# Patient Record
Sex: Male | Born: 1970 | Race: White | Hispanic: No | Marital: Single | Smoking: Current every day smoker
Health system: Southern US, Community
[De-identification: ages and names within clinical notes are randomized; demographics above are authoritative.]

---

## 1998-09-07 ENCOUNTER — Encounter: Payer: Self-pay | Admitting: Emergency Medicine

## 1998-09-07 ENCOUNTER — Emergency Department (HOSPITAL_COMMUNITY): Admission: EM | Admit: 1998-09-07 | Discharge: 1998-09-07 | Payer: Self-pay | Admitting: Emergency Medicine

## 1998-12-27 ENCOUNTER — Encounter: Payer: Self-pay | Admitting: Emergency Medicine

## 1998-12-27 ENCOUNTER — Emergency Department (HOSPITAL_COMMUNITY): Admission: EM | Admit: 1998-12-27 | Discharge: 1998-12-27 | Payer: Self-pay | Admitting: Emergency Medicine

## 2012-03-02 ENCOUNTER — Emergency Department (HOSPITAL_COMMUNITY)
Admission: EM | Admit: 2012-03-02 | Discharge: 2012-03-02 | Disposition: A | Discharge status: Home / Self Care | Payer: Self-pay | Attending: Emergency Medicine | Admitting: Emergency Medicine

## 2012-03-02 ENCOUNTER — Emergency Department (HOSPITAL_COMMUNITY): Payer: Self-pay

## 2012-03-02 ENCOUNTER — Encounter (HOSPITAL_COMMUNITY): Payer: Self-pay

## 2012-03-02 DIAGNOSIS — F17200 Nicotine dependence, unspecified, uncomplicated: Secondary | ICD-10-CM | POA: Insufficient documentation

## 2012-03-02 DIAGNOSIS — R079 Chest pain, unspecified: Secondary | ICD-10-CM | POA: Insufficient documentation

## 2012-03-02 DIAGNOSIS — R0781 Pleurodynia: Secondary | ICD-10-CM

## 2012-03-02 MED ORDER — OXYCODONE-ACETAMINOPHEN 5-325 MG PO TABS
2.0000 | ORAL_TABLET | Freq: Once | ORAL | Status: AC
  Administered 2012-03-02: 2 via ORAL
  Filled 2012-03-02: qty 2

## 2012-03-02 MED ORDER — OXYCODONE-ACETAMINOPHEN 5-325 MG PO TABS: 1.0000 | ORAL_TABLET | Freq: Four times a day (QID) | ORAL | Status: AC | PRN

## 2012-03-02 NOTE — ED Provider Notes (Signed)
History     CSN: [id number]  Arrival date & time 03/02/12  0730   First MD Initiated Contact with Patient 03/02/12 [phone]      Chief Complaint  Patient presents with  . Back Pain  . Fall    (Consider location/radiation/quality/duration/timing/severity/associated sxs/prior treatment) HPI Comments: Patient reports that while working yesterday he tripped over a hose and fell unto a tire rim.  He reports that his right lower ribs hit the tire rim when he fell.  He is currently having pain over the right lower ribs both anteriorly and posteriorly.  No pain over the thoracic spine.  He reports that he felt a pop when he fell.  He has not taken anything for pain.  Pain worse with movement and when taking deep breaths.  He denies any SOB.    Patient is a 41 y.o. male presenting with back pain and fall. The history is provided by the patient.  Back Pain  This is a new problem. The current episode started yesterday. The problem occurs constantly. The problem has not changed since onset.The pain is associated with falling. The pain does not radiate. The pain is moderate. The symptoms are aggravated by twisting and bending. Pertinent negatives include no fever, no numbness, no paresthesias, no paresis, no tingling and no weakness. He has tried nothing for the symptoms.  Fall Pertinent negatives include no fever, no numbness, no nausea, no vomiting and no tingling.    History reviewed. No pertinent past medical history.  History reviewed. No pertinent past surgical history.  No family history on file.  History  Substance Use Topics  . Smoking status: Current Everyday Smoker  . Smokeless tobacco: Not on file  . Alcohol Use: No      Review of Systems  Constitutional: Negative for fever and chills.  Respiratory: Negative for shortness of breath.   Gastrointestinal: Negative for nausea and vomiting.  Musculoskeletal: Positive for back pain. Negative for gait problem.  Skin: Negative for  color change and wound.  Neurological: Negative for tingling, weakness, numbness and paresthesias.    Allergies  Review of patient's allergies indicates no known allergies.  Home Medications   Current Outpatient Rx  Name Route Sig Dispense Refill  . IBUPROFEN 200 MG PO TABS Oral Take 800 mg by mouth every 6 (six) hours as needed. For pain      BP 167/94  Pulse 73  Temp 98 F (36.7 C) (Oral)  Resp 20  SpO2 98%  Physical Exam  Nursing note and vitals reviewed. Constitutional: He appears well-developed and well-nourished. No distress.  HENT:  Head: Normocephalic and atraumatic.  Mouth/Throat: Oropharynx is clear and moist.  Neck: Normal range of motion. Neck supple.  Cardiovascular: Normal rate, regular rhythm and normal heart sounds.   Pulmonary/Chest: Effort normal and breath sounds normal. No respiratory distress. He has no wheezes. He has no rales.   He exhibits tenderness.  Abdominal: Soft. Bowel sounds are normal.  Musculoskeletal: Normal range of motion. He exhibits no edema and no tenderness.       Cervical back: He exhibits normal range of motion, no tenderness, no bony tenderness, no swelling, no edema and no deformity.       Thoracic back: He exhibits normal range of motion, no tenderness, no bony tenderness, no swelling, no edema and no deformity.       Lumbar back: He exhibits normal range of motion, no tenderness, no bony tenderness, no swelling, no edema and no deformity.  Neurological:  He is alert.  Skin: Skin is warm, dry and intact. No abrasion, no bruising and no ecchymosis noted. He is not diaphoretic.  Psychiatric: He has a normal mood and affect.    ED Course  Procedures (including critical care time)  Labs Reviewed - No data to display Dg Ribs Unilateral W/chest Right  03/02/2012  *RADIOLOGY REPORT*  Clinical Data: Fall.  Right anterior rib pain.  RIGHT RIBS AND CHEST - 3+ VIEW  Comparison: None.  Findings: No displaced rib fracture or pneumothorax.   Trachea midline.  Cardiopericardial silhouette appears within normal limits.  The lungs are clear. Please note that plain film rib series have limited sensitivity for nondisplaced rib fractures.  If the patient continues to have pain, consider repeat examination in 1-2 weeks with marker over area of maximal tenderness.  Often periosteal reaction will be present at the site of occult rib fracture.  IMPRESSION: No displaced rib fracture.  Original Report Authenticated By: Andreas Newport, M.D.     1. Rib pain on right side       MDM  Patient with negative xrays.   Patient instructed to follow up with PCP and told that he may need to have repeat xrays in 1-2 weeks if pain persists.  Patient given short course of pain medication.  Patient in agreement with the plan.  Return precautions discussed.        Pascal Lux [place], PA-C 03/02/12 1610

## 2012-03-02 NOTE — ED Notes (Signed)
Pt in from home with c/o mid back pain and rib pain states fell yesterday from standing position, states pain has increased this am states worse with deep breathing

## 2012-03-05 NOTE — ED Provider Notes (Signed)
Medical screening examination/treatment/procedure(s) were performed by non-physician practitioner and as supervising physician I was immediately available for consultation/collaboration.  Toy Baker, MD 03/05/12 [phone]

## 2016-10-22 ENCOUNTER — Emergency Department (HOSPITAL_COMMUNITY)
Admission: EM | Admit: 2016-10-22 | Discharge: 2016-10-22 | Disposition: A | Discharge status: Home / Self Care | Payer: Self-pay | Attending: Emergency Medicine | Admitting: Emergency Medicine

## 2016-10-22 ENCOUNTER — Encounter (HOSPITAL_COMMUNITY): Payer: Self-pay

## 2016-10-22 DIAGNOSIS — L723 Sebaceous cyst: Secondary | ICD-10-CM | POA: Insufficient documentation

## 2016-10-22 DIAGNOSIS — F17200 Nicotine dependence, unspecified, uncomplicated: Secondary | ICD-10-CM | POA: Insufficient documentation

## 2016-10-22 MED ORDER — LIDOCAINE-EPINEPHRINE (PF) 2 %-1:200000 IJ SOLN
10.0000 mL | Freq: Once | INTRAMUSCULAR | Status: AC
  Administered 2016-10-22: 10 mL
  Filled 2016-10-22: qty 20

## 2016-10-22 MED ORDER — TETANUS-DIPHTH-ACELL PERTUSSIS 5-2.5-18.5 LF-MCG/0.5 IM SUSP
0.5000 mL | Freq: Once | INTRAMUSCULAR | Status: AC
  Administered 2016-10-22: 0.5 mL via INTRAMUSCULAR
  Filled 2016-10-22: qty 0.5

## 2016-10-22 MED ORDER — SULFAMETHOXAZOLE-TRIMETHOPRIM 800-160 MG PO TABS: 1.0000 | ORAL_TABLET | Freq: Two times a day (BID) | ORAL | 0 refills | Status: AC

## 2016-10-22 MED ORDER — IBUPROFEN 800 MG PO TABS: 800.0000 mg | ORAL_TABLET | Freq: Three times a day (TID) | ORAL | 0 refills | Status: DC

## 2016-10-22 NOTE — ED Provider Notes (Signed)
[facility]-EMERGENCY DEPT Provider Note   CSN: [id number] Arrival date & time: 10/22/16  1247 By signing my name below, I, Reginald HedgerElizabeth Weber, attest that this documentation has been prepared under the direction and in the presence of non-physician practitioner, Harolyn RutherfordShawn Joy, PA-C. Electronically Signed: Levon HedgerElizabeth Weber, Scribe. 10/22/2016. 1:42 PM.   History   Chief Complaint Chief Complaint  Patient presents with  . Abscess   HPI Comments: Reginald Weber is a 46 y.o. male who presents to the Emergency Department complaining of an area of moderate pain, redness, and swelling to the back onset one year ago, but progressively worsening x 3 weeks. Pt states pain is exacerbated with palpation and direct pressure. He describes the area as a "lump." He has not seen a provider for this issue in the past. No treatments have been attempted. Denies fever/chills, cough, shortness of breath, drainage from the area, or any other complaints.    The history is provided by the patient. No language interpreter was used.    History reviewed. No pertinent past medical history.  There are no active problems to display for this patient.  History reviewed. No pertinent surgical history.   Home Medications    Prior to Admission medications   Medication Sig Start Date End Date Taking? Authorizing Provider  ibuprofen (ADVIL,MOTRIN) 200 MG tablet Take 800 mg by mouth every 6 (six) hours as needed. For pain    Historical Provider, MD  ibuprofen (ADVIL,MOTRIN) 800 MG tablet Take 1 tablet (800 mg total) by mouth 3 (three) times daily. 10/22/16   Shawn C Joy, PA-C  sulfamethoxazole-trimethoprim (BACTRIM DS,SEPTRA DS) 800-160 MG tablet Take 1 tablet by mouth 2 (two) times daily. 10/22/16 10/29/16  Anselm PancoastShawn C Joy, PA-C   Family History History reviewed. No pertinent family history.  Social History Social History  Substance Use Topics  . Smoking status: Current Every Day Smoker  . Smokeless tobacco: Never Used  . Alcohol  use Yes     Comment: daily    Allergies   Patient has no known allergies.  Review of Systems Review of Systems  Constitutional: Negative for fever.  Respiratory: Negative for cough and shortness of breath.   Skin: Positive for color change.  All other systems reviewed and are negative.  Physical Exam Updated Vital Signs BP (!) 159/109 (BP Location: Right Arm)   Pulse 80   Temp 97.9 F (36.6 C) (Oral)   Resp 18   SpO2 98%   Physical Exam  Constitutional: He appears well-developed and well-nourished. No distress.  HENT:  Head: Normocephalic and atraumatic.  Eyes: Conjunctivae are normal.  Neck: Neck supple.  Cardiovascular: Normal rate and regular rhythm.   Pulmonary/Chest: Effort normal.  Neurological: He is alert.  Skin: Skin is warm and dry. He is not diaphoretic. There is erythema.  Soft, half dollar-sized, immobile mass to the upper left back about 4 cm lateral of the thoracic spine. Area is tender and erythematous. Overlying skin blanches.  Psychiatric: He has a normal mood and affect. His behavior is normal.  Nursing note and vitals reviewed.  ED Treatments / Results  DIAGNOSTIC STUDIES:  Oxygen Saturation is 98% on RA, normal by my interpretation.    COORDINATION OF CARE:  1:25 PM Discussed treatment plan with pt at bedside and pt agreed to plan.   Labs (all labs ordered are listed, but only abnormal results are displayed) Labs Reviewed - No data to display  EKG  EKG Interpretation None      Radiology No  results found.  Procedures .Marland KitchenIncision and Drainage Date/Time: 10/22/2016 3:19 PM Performed by: Anselm Pancoast Authorized by: Anselm Pancoast   Consent:    Consent obtained:  Verbal   Consent given by:  Patient   Risks discussed:  Bleeding, incomplete drainage, infection and pain Location:    Type:  Cyst   Size:  4x3cm   Location:  Trunk   Trunk location:  Back Pre-procedure details:    Skin preparation:  Betadine Anesthesia (see MAR for  exact dosages):    Anesthesia method:  Local infiltration   Local anesthetic:  Lidocaine 2% WITH epi Procedure type:    Complexity:  Complex Procedure details:    Needle aspiration: no     Incision types:  Single straight   Incision depth:  Dermal   Scalpel blade:  11   Wound management:  Probed and deloculated, irrigated with saline, debrided and extensive cleaning   Drainage characteristics: sebaceous material.   Drainage amount:  Copious   Wound treatment:  Wound left open   Packing materials:  None Post-procedure details:    Patient tolerance of procedure:  Tolerated well, no immediate complications Comments:     A large enough incision was made to allow for continued drainage.    (including critical care time)  EMERGENCY DEPARTMENT US SOFT TISSUE INTERPRETATION "Study: Limited Soft Tissue Ultrasound"  INDICATIONS: Pain Multiple views of the body part were obtained in real-time with a multi-frequency linear probe  PERFORMED BY: Myself IMAGES ARCHIVED?: No SIDE:Left BODY PART:Upper back INTERPRETATION:  Fluid collection needing drainage - Fluid collection measuring about 3x4 cm at a depth of approx. 1 cm.    Medications Ordered in ED Medications  lidocaine-EPINEPHrine (XYLOCAINE W/EPI) 2 %-1:200000 (PF) injection 10 mL (10 mLs Infiltration Given 10/22/16 1456)  Tdap (BOOSTRIX) injection 0.5 mL (0.5 mLs Intramuscular Given 10/22/16 1457)     Initial Impression / Assessment and Plan / ED Course  I have reviewed the triage vital signs and the nursing notes.  Pertinent labs & imaging results that were available during my care of the patient were reviewed by me and considered in my medical decision making (see chart for details).     Patient presents with a mass to his upper back. Likely sebaceous cysts. This was drained without immediate complication. Wound check in 3 days. Antibiotic therapy initiated due to the onset of tenderness and erythema on exam, possibly  indicating the beginning of an infected cyst. Wound care and return precautions discussed. Patient voices understanding of all instructions and is comfortable with discharge.     Final Clinical Impressions(s) / ED Diagnoses   Final diagnoses:  Sebaceous cyst   New Prescriptions Discharge Medication List as of 10/22/2016  3:24 PM    START taking these medications   Details  !! ibuprofen (ADVIL,MOTRIN) 800 MG tablet Take 1 tablet (800 mg total) by mouth 3 (three) times daily., Starting Sun 10/22/2016, Print    sulfamethoxazole-trimethoprim (BACTRIM DS,SEPTRA DS) 800-160 MG tablet Take 1 tablet by mouth 2 (two) times daily., Starting Sun 10/22/2016, Until Sun 10/29/2016, Print     !! - Potential duplicate medications found. Please discuss with provider.     I personally performed the services described in this documentation, which was scribed in my presence. The recorded information has been reviewed and is accurate.   Anselm Pancoast, PA-C 10/24/16 1610    Charlynne Pander, MD 10/25/16 [phone]

## 2016-10-22 NOTE — ED Triage Notes (Signed)
Pt here with "knot" on back.  Painful.  Started for a while.  Has worsened in past 3 weeks.

## 2016-10-22 NOTE — ED Notes (Signed)
2x2 covered by folded 4x4 secured with paper tape over I&D site (left back)

## 2016-10-22 NOTE — Discharge Instructions (Signed)
Remove the bandage after 24 hours. Clean the wound and surrounding area gently with tap water and mild soap. Rinse well and blot dry. You may shower, but avoid submerging the wound, such as with a bath or swimming. Clean the wound daily to prevent infection. You may use Tylenol, naproxen, ibuprofen for pain. You may replace the bandage for as long as the wound is draining.  Return to the ED or go to a primary care office for a wound check in 3 days.  Return to the ED sooner should signs of infection arise, such as spreading redness, puffiness/swelling, pus draining from the wound, severe increase in pain, or any other major issues.  Please take all of your antibiotics until finished!   You may develop abdominal discomfort or diarrhea from the antibiotic.  You may help offset this with probiotics which you can buy or get in yogurt. Do not eat or take the probiotics until 2 hours after your antibiotic.

## 2018-03-23 ENCOUNTER — Encounter (HOSPITAL_COMMUNITY): Payer: Self-pay | Admitting: *Deleted

## 2018-03-23 ENCOUNTER — Other Ambulatory Visit: Payer: Self-pay

## 2018-03-23 ENCOUNTER — Emergency Department (HOSPITAL_COMMUNITY)
Admission: EM | Admit: 2018-03-23 | Discharge: 2018-03-23 | Disposition: A | Discharge status: Home / Self Care | Payer: Self-pay | Attending: Emergency Medicine | Admitting: Emergency Medicine

## 2018-03-23 DIAGNOSIS — L237 Allergic contact dermatitis due to plants, except food: Secondary | ICD-10-CM | POA: Insufficient documentation

## 2018-03-23 DIAGNOSIS — L299 Pruritus, unspecified: Secondary | ICD-10-CM | POA: Insufficient documentation

## 2018-03-23 DIAGNOSIS — L255 Unspecified contact dermatitis due to plants, except food: Secondary | ICD-10-CM

## 2018-03-23 DIAGNOSIS — F17210 Nicotine dependence, cigarettes, uncomplicated: Secondary | ICD-10-CM | POA: Insufficient documentation

## 2018-03-23 MED ORDER — PREDNISONE 10 MG (21) PO TBPK: ORAL_TABLET | ORAL | 0 refills | Status: AC

## 2018-03-23 MED ORDER — CYCLOBENZAPRINE HCL 10 MG PO TABS: 10.0000 mg | ORAL_TABLET | Freq: Every day | ORAL | 0 refills | Status: DC

## 2018-03-23 MED ORDER — HYDROXYZINE HCL 25 MG PO TABS: 25.0000 mg | ORAL_TABLET | Freq: Three times a day (TID) | ORAL | 0 refills | Status: AC | PRN

## 2018-03-23 MED ORDER — BETAMETHASONE SOD PHOS & ACET 6 (3-3) MG/ML IJ SUSP
6.0000 mg | Freq: Once | INTRAMUSCULAR | Status: AC
  Administered 2018-03-23: 6 mg via INTRAMUSCULAR
  Filled 2018-03-23: qty 5

## 2018-03-23 NOTE — ED Notes (Signed)
Pt reports attempting to clear rash with "straight clorox bleach" onto his skin.

## 2018-03-23 NOTE — ED Triage Notes (Signed)
Pt complains of rash to bilateral arms and forehead since trimming poison ivy yesterday.

## 2018-03-23 NOTE — ED Provider Notes (Signed)
[facility]-EMERGENCY DEPT Provider Note   CSN: [id number] Arrival date & time: 03/23/18  1125     History   Chief Complaint Chief Complaint  Patient presents with  . Rash    HPI Reginald Weber is a 47 y.o. male who presents to the ED for a rash. The rash started yesterday. Patient reports that he was cutting bushes at work that had poison oak in them. Patient c/o itching and rash to forearms and face.   HPI  History reviewed. No pertinent past medical history.  There are no active problems to display for this patient.   History reviewed. No pertinent surgical history.      Home Medications    Prior to Admission medications   Medication Sig Start Date End Date Taking? Authorizing Provider  hydrOXYzine (ATARAX/VISTARIL) 25 MG tablet Take 1 tablet (25 mg total) by mouth every 8 (eight) hours as needed. 03/23/18   Janne Napoleon, NP  ibuprofen (ADVIL,MOTRIN) 200 MG tablet Take 800 mg by mouth every 6 (six) hours as needed. For pain    [provider]  ibuprofen (ADVIL,MOTRIN) 800 MG tablet Take 1 tablet (800 mg total) by mouth 3 (three) times daily. 10/22/16   Joy, Shawn C, PA-C  predniSONE (STERAPRED UNI-PAK 21 TAB) 10 MG (21) TBPK tablet Starting tomorrow 03/24/18 take 6 tablets (day one) then 5, 4, 3, 2, 1 03/23/18   Janne Napoleon, NP    Family History No family history on file.  Social History Social History   Tobacco Use  . Smoking status: Current Every Day Smoker  . Smokeless tobacco: Never Used  Substance Use Topics  . Alcohol use: Yes    Comment: daily  . Drug use: No     Allergies   Patient has no known allergies.   Review of Systems Review of Systems  Skin: Positive for rash.  All other systems reviewed and are negative.    Physical Exam Updated Vital Signs BP 132/89 (BP Location: Right Arm)   Pulse 74   Temp 97.7 F (36.5 C) (Oral)   Resp 16   Ht 6\' 3"  (1.905 m)   Wt 93.4 kg (206 lb)   SpO2 100%   BMI  25.75 kg/m   Physical Exam  Constitutional: He appears well-developed and well-nourished. No distress.  HENT:  Rash to face  Eyes: EOM are normal.  Neck: Neck supple.  Cardiovascular: Normal rate.  Pulmonary/Chest: Effort normal.  Musculoskeletal: Normal range of motion.  Neurological: He is alert.  Skin: Rash noted.  Vesicular, linear rash to forearms and face.   Psychiatric: He has a normal mood and affect.  Nursing note and vitals reviewed.    ED Treatments / Results  Labs (all labs ordered are listed, but only abnormal results are displayed) Labs Reviewed - No data to display  Radiology No results found.  Procedures Procedures (including critical care time)  Medications Ordered in ED Medications  betamethasone acetate-betamethasone sodium phosphate (CELESTONE) injection 6 mg (6 mg Intramuscular Given 03/23/18 1309)     Initial Impression / Assessment and Plan / ED Course  I have reviewed the triage vital signs and the nursing notes. 47 y.o. male here for rash and itching stable for d/c without signs of infection. Will treat for itching and contact dermatitis. Patient will return as needed for any problems.    Final Clinical Impressions(s) / ED Diagnoses   Final diagnoses:  Contact dermatitis due to plant  Kerrie Buffaloeese, Hope OrientM, [place]NP 03/23/18 2132    Sabas SousBero, Michael M, MD 03/24/18 [phone]

## 2018-03-23 NOTE — Discharge Instructions (Addendum)
Use Calamine lotion to help dry the areas. Take the medication as directed. The medicine for itching can make you sleepy so do not drive while taking it.

## 2018-03-23 NOTE — ED Notes (Signed)
Bed: WTR5 Expected date:  Expected time:  Means of arrival:  Comments: 

## 2018-05-14 ENCOUNTER — Other Ambulatory Visit: Payer: Self-pay

## 2018-05-14 ENCOUNTER — Emergency Department (HOSPITAL_COMMUNITY)
Admission: EM | Admit: 2018-05-14 | Discharge: 2018-05-15 | Disposition: A | Discharge status: Home / Self Care | Payer: Self-pay | Attending: Emergency Medicine | Admitting: Emergency Medicine

## 2018-05-14 ENCOUNTER — Encounter (HOSPITAL_COMMUNITY): Payer: Self-pay | Admitting: Emergency Medicine

## 2018-05-14 DIAGNOSIS — L03115 Cellulitis of right lower limb: Secondary | ICD-10-CM

## 2018-05-14 DIAGNOSIS — F17200 Nicotine dependence, unspecified, uncomplicated: Secondary | ICD-10-CM | POA: Insufficient documentation

## 2018-05-14 DIAGNOSIS — L0291 Cutaneous abscess, unspecified: Secondary | ICD-10-CM

## 2018-05-14 DIAGNOSIS — L02415 Cutaneous abscess of right lower limb: Secondary | ICD-10-CM | POA: Insufficient documentation

## 2018-05-14 MED ORDER — HYDROCODONE-ACETAMINOPHEN 5-325 MG PO TABS
1.0000 | ORAL_TABLET | Freq: Once | ORAL | Status: AC
  Administered 2018-05-14: 1 via ORAL
  Filled 2018-05-14: qty 1

## 2018-05-14 MED ORDER — IBUPROFEN 200 MG PO TABS
200.0000 mg | ORAL_TABLET | Freq: Once | ORAL | Status: AC
  Administered 2018-05-14: 200 mg via ORAL
  Filled 2018-05-14: qty 1

## 2018-05-14 MED ORDER — LIDOCAINE-EPINEPHRINE (PF) 2 %-1:200000 IJ SOLN
10.0000 mL | Freq: Once | INTRAMUSCULAR | Status: AC
  Administered 2018-05-14: 10 mL
  Filled 2018-05-14: qty 20

## 2018-05-14 MED ORDER — VANCOMYCIN HCL 10 G IV SOLR
2000.0000 mg | Freq: Once | INTRAVENOUS | Status: AC
  Administered 2018-05-14: 2000 mg via INTRAVENOUS
  Filled 2018-05-14: qty 2000

## 2018-05-14 NOTE — ED Provider Notes (Cosign Needed Addendum)
[facility]-EMERGENCY DEPT Provider Note   CSN: [id number] Arrival date & time: 05/14/18  2009     History   Chief Complaint Chief Complaint  Patient presents with  . Insect Bite    HPI Reginald Weber is a 47 y.o. male with no significant past medical history who presents today for evaluation of an wound to his right thigh.  He reports that he noticed a wound about 3 days ago and it started as an ingrown hair.  He did not see any insects in this area.  He says that it has gradually gotten worse.  He reports that he has tried warm soaks in the shower with mild relief.  He denies fevers.  No change in appetite, N/V/D.    HPI  History reviewed. No pertinent past medical history.  There are no active problems to display for this patient.   History reviewed. No pertinent surgical history.      Home Medications    Prior to Admission medications   Medication Sig Start Date End Date Taking? Authorizing Provider  cephALEXin (KEFLEX) 500 MG capsule Take 1 capsule (500 mg total) by mouth 4 (four) times daily for 10 days. 05/15/18 05/25/18  Cristina Gong, PA-C  doxycycline (VIBRAMYCIN) 100 MG capsule Take 1 capsule (100 mg total) by mouth 2 (two) times daily. 05/15/18   Cristina Gong, PA-C  HYDROcodone-acetaminophen (NORCO/VICODIN) 5-325 MG tablet Take 1 tablet by mouth every 6 (six) hours as needed. 05/15/18   Cristina Gong, PA-C  hydrOXYzine (ATARAX/VISTARIL) 25 MG tablet Take 1 tablet (25 mg total) by mouth every 8 (eight) hours as needed. Patient not taking: Reported on 05/14/2018 03/23/18   Janne Napoleon, NP  predniSONE (STERAPRED UNI-PAK 21 TAB) 10 MG (21) TBPK tablet Starting tomorrow 03/24/18 take 6 tablets (day one) then 5, 4, 3, 2, 1 Patient not taking: Reported on 05/14/2018 03/23/18   Janne Napoleon, NP    Family History No family history on file.  Social History Social History   Tobacco Use  . Smoking status: Current Every Day  Smoker  . Smokeless tobacco: Never Used  Substance Use Topics  . Alcohol use: Yes    Comment: daily  . Drug use: No     Allergies   Patient has no known allergies.   Review of Systems Review of Systems  Constitutional: Negative for chills and fever.  Respiratory: Negative for shortness of breath.   Gastrointestinal: Negative for abdominal pain, nausea and vomiting.  Genitourinary:       He denies scrotal or testicular pain or redness.    Musculoskeletal: Negative for neck pain.  Skin: Positive for color change and wound.  Neurological: Negative for weakness and headaches.  All other systems reviewed and are negative.    Physical Exam Updated Vital Signs BP (!) 99/50   Pulse 88   Temp 99.5 F (37.5 C) (Oral)   Resp 15   Ht 6\' 2"  (1.88 m)   Wt 93.2 kg   SpO2 98%   BMI 26.38 kg/m   Physical Exam  Constitutional: He appears well-developed and well-nourished. No distress.  HENT:  Head: Normocephalic and atraumatic.  Eyes: Conjunctivae are normal. Right eye exhibits no discharge. Left eye exhibits no discharge. No scleral icterus.  Neck: Normal range of motion.  Cardiovascular: Normal rate and regular rhythm.  Pulmonary/Chest: Effort normal. No stridor. No respiratory distress.  Abdominal: He exhibits no distension.  Musculoskeletal: He exhibits no edema or deformity.  Neurological: He is alert. He exhibits normal muscle tone.  Skin: Skin is warm and dry. He is not diaphoretic.  There is a 42x30 cm area of redness primarily on the medial right thigh.  There is a pustule present on the proximal aspect.  Please see clinical images.    Psychiatric: He has a normal mood and affect. His behavior is normal.  Nursing note and vitals reviewed.          Erythema measures 42cm by 30 cm ED Treatments / Results  Labs (all labs ordered are listed, but only abnormal results are displayed) Labs Reviewed - No data to display  EKG None  Radiology No results  found.  Procedures .Marland Kitchen.Incision and Drainage Date/Time: 05/14/2018 11:59 PM Performed by: Cristina GongHammond, Farhan Jean W, PA-C Authorized by: Cristina GongHammond, Irving Bloor W, PA-C   Consent:    Consent obtained:  Verbal   Consent given by:  Patient   Risks discussed:  Bleeding, incomplete drainage, pain and infection (Damage to other structures, need for additional procedures)   Alternatives discussed:  No treatment, alternative treatment and referral Location:    Type:  Abscess   Size:  1x2   Location:  Lower extremity   Lower extremity location:  Leg   Leg location:  R upper leg Pre-procedure details:    Skin preparation:  Chloraprep Anesthesia (see MAR for exact dosages):    Anesthesia method:  Local infiltration   Local anesthetic:  Lidocaine 2% WITH epi Procedure type:    Complexity:  Complex Procedure details:    Incision types:  Stab incision   Incision depth:  Subcutaneous   Scalpel blade:  11   Wound management:  Probed and deloculated   Drainage:  Purulent   Drainage amount:  Scant   Packing materials:  None Post-procedure details:    Patient tolerance of procedure:  Tolerated well, no immediate complications   EMERGENCY DEPARTMENT US SOFT TISSUE INTERPRETATION "Study: Limited Soft Tissue Ultrasound"  INDICATIONS: Pain and Soft tissue infection Multiple views of the body part were obtained in real-time with a multi-frequency linear probe  PERFORMED BY: Myself IMAGES ARCHIVED?: Yes SIDE:Right  BODY PART:Lower extremity INTERPRETATION:  Abcess present and Cellulitis present     Medications Ordered in ED Medications  vancomycin (VANCOCIN) 2,000 mg in sodium chloride 0.9 % 500 mL IVPB (2,000 mg Intravenous New Bag/Given 05/14/18 2319)  lidocaine-EPINEPHrine (XYLOCAINE W/EPI) 2 %-1:200000 (PF) injection 10 mL (10 mLs Infiltration Given by Other 05/14/18 2233)  ibuprofen (ADVIL,MOTRIN) tablet 200 mg (200 mg Oral Given 05/14/18 2232)  HYDROcodone-acetaminophen (NORCO/VICODIN) 5-325  MG per tablet 1 tablet (1 tablet Oral Given 05/14/18 2232)     Initial Impression / Assessment and Plan / ED Course  I have reviewed the triage vital signs and the nursing notes.  Pertinent labs & imaging results that were available during my care of the patient were reviewed by me and considered in my medical decision making (see chart for details).    Reginald Weber presents today for evaluation of a swelling and redness on his right medial thigh.  Ultrasound was used showing subcutaneous fluid collection.  I and D was performed.  Based on the extent of redness around wound will give dose of vancomycin.  At shift change [facility] who will follow pending studies, re-evaulate and determine disposition.    Final Clinical Impressions(s) / ED Diagnoses   Final diagnoses:  Abscess  Cellulitis of right lower extremity    ED Discharge  Orders         Ordered    HYDROcodone-acetaminophen (NORCO/VICODIN) 5-325 MG tablet  Every 6 hours PRN     05/15/18 0008    doxycycline (VIBRAMYCIN) 100 MG capsule  2 times daily     05/15/18 0008    cephALEXin (KEFLEX) 500 MG capsule  4 times daily     05/15/18 0008           Cristina Gong, PA-C 05/15/18 0010    Sabas Sous, MD 05/15/18 0018    Cristina Gong, PA-C 05/15/18 0028

## 2018-05-14 NOTE — ED Provider Notes (Signed)
Reginald Weber is a 47 y.o. male, presenting to the ED with pain, redness, and swelling to the right upper leg.  HPI from Reginald SafeElizabeth Hammond, PA-Weber: "Reginald Weber is a 47 y.o. male with no significant past medical history who presents today for evaluation of an wound to his right thigh.  He reports that he noticed a wound about 3 days ago and it started as an ingrown hair.  He did not see any insects in this area.  He says that it has gradually gotten worse.  He reports that he has tried warm soaks in the shower with mild relief.  He denies fevers.  No change in appetite, N/V/D."  History reviewed. No pertinent past medical history.   Physical Exam  BP (!) 99/50   Pulse 88   Temp 99.5 F (37.5 Weber) (Oral)   Resp 15   Ht 6\' 2"  (1.88 m)   Wt 93.2 kg   SpO2 98%   BMI 26.38 kg/m   Physical Exam  Constitutional: He appears well-developed and well-nourished. No distress.  HENT:  Head: Normocephalic and atraumatic.  Eyes: Conjunctivae are normal.  Neck: Neck supple.  Cardiovascular: Normal rate, regular rhythm, normal heart sounds and intact distal pulses.  Pulmonary/Chest: Effort normal and breath sounds normal. No respiratory distress.  Abdominal: Soft. There is no tenderness. There is no guarding.  Musculoskeletal: He exhibits no edema.  Lymphadenopathy:    He has no cervical adenopathy.  Neurological: He is alert.  Skin: Skin is warm and dry. He is not diaphoretic. There is erythema.  Erythema and tenderness as previously described by PA Reginald Weber.  Erythema is still within the previously drawn lines.  Psychiatric: He has a normal mood and affect. His behavior is normal.  Nursing note and vitals reviewed.   ED Course/Procedures   Clinical Course as of May 15 626  Tue May 14, 2018  2330 Suspect this was due to the patient's position.  BP(!): 99/50 [SJ]    Clinical Course User Index [SJ] Reginald Weber, Reginald Zagami C, PA-Weber    Procedures     MDM   Patient care handoff report  received from Reginald SafeElizabeth Hammond, PA-Weber. Plan: Patient to finish vancomycin infusion and be discharged.  Patient presents with what appears to be right upper leg cellulitis. Patient is nontoxic appearing, afebrile, not tachycardic, not tachypneic, not hypotensive, and is in no apparent distress.  He completed an infusion of vancomycin here in the ED. He is to return in 2 days for recheck. The patient was given instructions for home care as well as return precautions. Patient voices understanding of these instructions, accepts the plan, and is comfortable with discharge.   Vitals:   05/14/18 2025 05/14/18 2200 05/14/18 2230 05/14/18 2300  BP: 127/79 124/64 110/65 (!) 99/50  Pulse: (!) 104 90 86 88  Resp: 15   15  Temp: 99.5 F (37.5 Weber)     TempSrc: Oral     SpO2: 95% 96% 98% 98%  Weight: 93.2 kg     Height: 6\' 2"  (1.88 m)      Vitals:   05/14/18 2230 05/14/18 2300 05/15/18 0130 05/15/18 0152  BP: 110/65 (!) 99/50 139/75 (!) 150/74  Pulse: 86 88 86 77  Resp:  15  15  Temp:    97.7 F (36.5 Weber)  TempSrc:    Oral  SpO2: 98% 98% 96% 96%  Weight:      Height:  Reginald Pancoast, PA-Weber 05/15/18 0628    Paula Libra, MD 05/15/18 [phone]

## 2018-05-14 NOTE — ED Triage Notes (Signed)
Patient reports suspected insect bite to right thigh x3 days. Wound red and patient reports throbbing and warm to touch.

## 2018-05-15 MED ORDER — CEPHALEXIN 500 MG PO CAPS: 500.0000 mg | ORAL_CAPSULE | Freq: Four times a day (QID) | ORAL | 0 refills | Status: AC

## 2018-05-15 MED ORDER — DOXYCYCLINE HYCLATE 100 MG PO CAPS: 100.0000 mg | ORAL_CAPSULE | Freq: Two times a day (BID) | ORAL | 0 refills | Status: DC

## 2018-05-15 MED ORDER — HYDROCODONE-ACETAMINOPHEN 5-325 MG PO TABS: 1.0000 | ORAL_TABLET | Freq: Four times a day (QID) | ORAL | 0 refills | Status: AC | PRN

## 2018-05-15 NOTE — Discharge Instructions (Signed)
Please take Ibuprofen (Advil, motrin) and Tylenol (acetaminophen) to relieve your pain.  You may take up to 600 MG (3 pills) of normal strength ibuprofen every 8 hours as needed.  In between doses of ibuprofen you make take tylenol, up to 1,000 mg (two extra strength pills).  Do not take more than 3,000 mg tylenol in a 24 hour period.  Please check all medication labels as many medications such as pain and cold medications may contain tylenol.  Do not drink alcohol while taking these medications.  Do not take other NSAID'S while taking ibuprofen (such as aleve or naproxen).  Please take ibuprofen with food to decrease stomach upset. ° °Today you received medications that may make you sleepy or impair your ability to make decisions.  For the next 24 hours please do not drive, operate heavy machinery, care for a small child with out another adult present, or perform any activities that may cause harm to you or someone else if you were to fall asleep or be impaired.  ° °You are being prescribed a medication which may make you sleepy. Please follow up of listed precautions for at least 24 hours after taking one dose. ° °You may have diarrhea from the antibiotics.  It is very important that you continue to take the antibiotics even if you get diarrhea unless a medical professional tells you that you may stop taking them.  If you stop too early the bacteria you are being treated for will become stronger and you may need different, more powerful antibiotics that have more side effects and worsening diarrhea.  Please stay well hydrated and consider probiotics as they may decrease the severity of your diarrhea.   °

## 2019-09-18 ENCOUNTER — Emergency Department (HOSPITAL_COMMUNITY)
Admission: EM | Admit: 2019-09-18 | Discharge: 2019-09-18 | Disposition: A | Discharge status: Home / Self Care | Payer: Self-pay | Attending: Emergency Medicine | Admitting: Emergency Medicine

## 2019-09-18 ENCOUNTER — Other Ambulatory Visit: Payer: Self-pay

## 2019-09-18 ENCOUNTER — Emergency Department (HOSPITAL_COMMUNITY): Payer: Self-pay

## 2019-09-18 ENCOUNTER — Encounter (HOSPITAL_COMMUNITY): Payer: Self-pay

## 2019-09-18 DIAGNOSIS — M7701 Medial epicondylitis, right elbow: Secondary | ICD-10-CM | POA: Insufficient documentation

## 2019-09-18 DIAGNOSIS — Y9389 Activity, other specified: Secondary | ICD-10-CM | POA: Insufficient documentation

## 2019-09-18 DIAGNOSIS — Y990 Civilian activity done for income or pay: Secondary | ICD-10-CM | POA: Insufficient documentation

## 2019-09-18 DIAGNOSIS — Y9259 Other trade areas as the place of occurrence of the external cause: Secondary | ICD-10-CM | POA: Insufficient documentation

## 2019-09-18 DIAGNOSIS — X500XXA Overexertion from strenuous movement or load, initial encounter: Secondary | ICD-10-CM | POA: Insufficient documentation

## 2019-09-18 DIAGNOSIS — Z79899 Other long term (current) drug therapy: Secondary | ICD-10-CM | POA: Insufficient documentation

## 2019-09-18 DIAGNOSIS — F17210 Nicotine dependence, cigarettes, uncomplicated: Secondary | ICD-10-CM | POA: Insufficient documentation

## 2019-09-18 DIAGNOSIS — M25521 Pain in right elbow: Secondary | ICD-10-CM

## 2019-09-18 NOTE — Discharge Instructions (Signed)
You have been diagnosed today with right elbow pain.  At this time there does not appear to be the presence of an emergent medical condition, however there is always the potential for conditions to change. Please read and follow the below instructions.  Please return to the Emergency Department immediately for any new or worsening symptoms. Please be sure to follow up with your Primary Care Provider within one week regarding your visit today; please call their office to schedule an appointment even if you are feeling better for a follow-up visit. Your x-ray today did not show any broken bones, it did show spurs at your medial epicondyle and olecranon.  As we discussed unseen injuries may be present including tendon/ligamentous injury or unseen fractures.  You may call the orthopedic specialist Dr. Magnus Ivan on your discharge paperwork tomorrow morning to schedule a follow-up appointment for further evaluation management.  Please use rest, ice and elevation to help with your symptoms.  You may also use the splint given to you today to help protect your arm from further injury.  You may use over-the-counter anti-inflammatory such as Tylenol and ibuprofen as directed on the packaging to help with pain.  Get help right away if: Your pain is severe. You cannot move your wrist. You have numbness/weakness or tingling You have fever or redness to the area. You have any new/concerning or worsening of symptoms  Please read the additional information packets attached to your discharge summary.  Do not take your medicine if  develop an itchy rash, swelling in your mouth or lips, or difficulty breathing; call 911 and seek immediate emergency medical attention if this occurs.  Note: Portions of this text may have been transcribed using voice recognition software. Every effort was made to ensure accuracy; however, inadvertent computerized transcription errors may still be present.

## 2019-09-18 NOTE — ED Triage Notes (Signed)
Patient c/o chronic right elbow pain recently worsening. States heavy lifting at work. Reports usually able to drink alcohol to help with pain but was unable to sleep last night because of pain.

## 2019-09-18 NOTE — ED Provider Notes (Signed)
[facility]-EMERGENCY DEPT Provider Note   CSN: [id number] Arrival date & time: 09/18/19  1523     History Chief Complaint  Patient presents with  . Arm Pain    Reginald Weber is a 49 y.o. male presents today for right elbow pain.  Patient reports that he works at a [employer], 1 month ago he was lifting a tire when he heard a pop in his right elbow, he reports the next day he developed pain around his medial epicondyle, he describes a throbbing sensation constant worsened with movement and palpation nonradiating improved with rest and moderate intensity.  He denies any fall/injury, fever/chills, swelling/color change, numbness/tingling, weakness or any additional concerns.  HPI     History reviewed. No pertinent past medical history.  There are no problems to display for this patient.   History reviewed. No pertinent surgical history.     History reviewed. No pertinent family history.  Social History   Tobacco Use  . Smoking status: Current Every Day Smoker  . Smokeless tobacco: Never Used  Substance Use Topics  . Alcohol use: Yes    Comment: daily  . Drug use: No    Home Medications Prior to Admission medications   Medication Sig Start Date End Date Taking? Authorizing Provider  doxycycline (VIBRAMYCIN) 100 MG capsule Take 1 capsule (100 mg total) by mouth 2 (two) times daily. 05/15/18   Cristina Gong, PA-C  HYDROcodone-acetaminophen (NORCO/VICODIN) 5-325 MG tablet Take 1 tablet by mouth every 6 (six) hours as needed. 05/15/18   Cristina Gong, PA-C  hydrOXYzine (ATARAX/VISTARIL) 25 MG tablet Take 1 tablet (25 mg total) by mouth every 8 (eight) hours as needed. Patient not taking: Reported on 05/14/2018 03/23/18   Janne Napoleon, NP  predniSONE (STERAPRED UNI-PAK 21 TAB) 10 MG (21) TBPK tablet Starting tomorrow 03/24/18 take 6 tablets (day one) then 5, 4, 3, 2, 1 Patient not taking: Reported on 05/14/2018  03/23/18   Janne Napoleon, NP    Allergies    Patient has no known allergies.  Review of Systems   Review of Systems Ten systems are reviewed and are negative for acute change except as noted in the HPI  Physical Exam Updated Vital Signs BP 138/84 (BP Location: Left Arm)   Pulse 72   Temp 97.9 F (36.6 C) (Oral)   Resp 20   SpO2 100%   Physical Exam Constitutional:      General: He is not in acute distress.    Appearance: Normal appearance. He is well-developed. He is not ill-appearing or diaphoretic.  HENT:     Head: Normocephalic and atraumatic.     Right Ear: External ear normal.     Left Ear: External ear normal.     Nose: Nose normal.  Eyes:     General: Vision grossly intact. Gaze aligned appropriately.     Pupils: Pupils are equal, round, and reactive to light.  Neck:     Trachea: Trachea and phonation normal. No tracheal deviation.  Cardiovascular:     Rate and Rhythm: Normal rate and regular rhythm.     Pulses:          Radial pulses are 2+ on the right side and 2+ on the left side.  Pulmonary:     Effort: Pulmonary effort is normal. No respiratory distress.  Abdominal:     General: There is no distension.     Palpations: Abdomen is soft.  Tenderness: There is no abdominal tenderness. There is no guarding or rebound.  Musculoskeletal:        General: Normal range of motion.     Right shoulder: Normal. No tenderness. Normal range of motion.     Left shoulder: Normal. No tenderness. Normal range of motion.     Right upper arm: Normal. No swelling or tenderness.     Left upper arm: Normal. No swelling or tenderness.     Right elbow: No swelling or effusion. Tenderness present in medial epicondyle.     Left elbow: Normal.     Right forearm: Normal. No swelling or tenderness.     Left forearm: Normal. No swelling or tenderness.     Right wrist: Normal. No tenderness. Normal range of motion.     Left wrist: Normal. No tenderness. Normal range of motion.      Right hand: Normal. Normal sensation. Normal capillary refill.     Left hand: Normal. Normal sensation. Normal capillary refill.     Cervical back: Normal range of motion.  Skin:    General: Skin is warm and dry.     Capillary Refill: Capillary refill takes less than 2 seconds.  Neurological:     Mental Status: He is alert.     GCS: GCS eye subscore is 4. GCS verbal subscore is 5. GCS motor subscore is 6.     Comments: Speech is clear and goal oriented, follows commands Major Cranial nerves without deficit, no facial droop Moves extremities without ataxia, coordination intact  Psychiatric:        Behavior: Behavior normal.     ED Results / Procedures / Treatments   Labs (all labs ordered are listed, but only abnormal results are displayed) Labs Reviewed - No data to display  EKG None  Radiology DG Elbow Complete Right  Result Date: 09/18/2019 CLINICAL DATA:  Pain for 1 month recently worsening, does heavy lifting at work EXAM: RIGHT ELBOW - COMPLETE 3+ VIEW COMPARISON:  None FINDINGS: Osseous mineralization normal. Olecranon spur. Additional medial epicondylar spur. No acute fracture, dislocation, or bone destruction. No joint effusion. IMPRESSION: No acute osseous abnormalities. Spurs at the medial epicondyle and olecranon. Electronically Signed   By: Ulyses Southward M.D.   On: 09/18/2019 16:55    Procedures Procedures (including critical care time)  Medications Ordered in ED Medications - No data to display  ED Course  I have reviewed the triage vital signs and the nursing notes.  Pertinent labs & imaging results that were available during my care of the patient were reviewed by me and considered in my medical decision making (see chart for details).    MDM Rules/Calculators/A&P                     DG Right Elbow:  IMPRESSION:  No acute osseous abnormalities.    Spurs at the medial epicondyle and olecranon.   - 49 year old male presents today for 1 month of right  elbow pain after lifting a tire at work.  He has tenderness around his right medial epicondyle, no overlying skin changes effusion or swelling.  He is neurovascular intact distally with good capillary refill, sensation and strong equal pulses.  Movement at the right shoulder, neck, wrist and hand is intact with appropriate strength.  He has some decreased range of motion at the right elbow due to pain.  No evidence of septic arthritis, cellulitis, DVT, compartment syndrome or other emergent pathologies.  Suspect patient today with  medial epicondylitis today, will place patient in sling and encouraged rice therapy and OTC anti-inflammatories.  Will give referral to orthopedist for further evaluation.  Patient states understanding of limitations of x-ray imaging today and that further work-up will be needed if symptoms do not improve.  He will be given work note.  At this time there does not appear to be any evidence of an acute emergency medical condition and the patient appears stable for discharge with appropriate outpatient follow up. Diagnosis was discussed with patient who verbalizes understanding of care plan and is agreeable to discharge. I have discussed return precautions with patient who verbalizes understanding of return precautions. Patient encouraged to follow-up with their PCP. All questions answered.  Note: Portions of this report may have been transcribed using voice recognition software. Every effort was made to ensure accuracy; however, inadvertent computerized transcription errors may still be present. Final Clinical Impression(s) / ED Diagnoses Final diagnoses:  Right elbow pain  Medial epicondylitis of right elbow    Rx / DC Orders ED Discharge Orders    None       Gari Crown 09/18/19 1714    Fredia Sorrow, MD 09/19/19 [phone]

## 2019-09-18 NOTE — ED Notes (Signed)
An After Visit Summary was printed and given to the patient. Discharge instructions given and no further questions at this time.  

## 2019-10-01 ENCOUNTER — Ambulatory Visit: Payer: Self-pay | Admitting: Orthopaedic Surgery

## 2019-11-03 ENCOUNTER — Encounter (HOSPITAL_COMMUNITY): Payer: Self-pay | Admitting: Obstetrics and Gynecology

## 2019-11-03 ENCOUNTER — Other Ambulatory Visit: Payer: Self-pay

## 2019-11-03 ENCOUNTER — Emergency Department (HOSPITAL_COMMUNITY)
Admission: EM | Admit: 2019-11-03 | Discharge: 2019-11-03 | Disposition: A | Discharge status: Home / Self Care | Payer: Self-pay | Attending: Emergency Medicine | Admitting: Emergency Medicine

## 2019-11-03 DIAGNOSIS — F17210 Nicotine dependence, cigarettes, uncomplicated: Secondary | ICD-10-CM | POA: Insufficient documentation

## 2019-11-03 DIAGNOSIS — Y999 Unspecified external cause status: Secondary | ICD-10-CM | POA: Insufficient documentation

## 2019-11-03 DIAGNOSIS — Y929 Unspecified place or not applicable: Secondary | ICD-10-CM | POA: Insufficient documentation

## 2019-11-03 DIAGNOSIS — S90821A Blister (nonthermal), right foot, initial encounter: Secondary | ICD-10-CM | POA: Insufficient documentation

## 2019-11-03 DIAGNOSIS — Y9301 Activity, walking, marching and hiking: Secondary | ICD-10-CM | POA: Insufficient documentation

## 2019-11-03 DIAGNOSIS — X58XXXA Exposure to other specified factors, initial encounter: Secondary | ICD-10-CM | POA: Insufficient documentation

## 2019-11-03 MED ORDER — DOXYCYCLINE HYCLATE 100 MG PO TABS: 100.0000 mg | ORAL_TABLET | Freq: Two times a day (BID) | ORAL | 0 refills | Status: AC

## 2019-11-03 MED ORDER — DOXYCYCLINE HYCLATE 100 MG PO CAPS
100.0000 mg | ORAL_CAPSULE | Freq: Two times a day (BID) | ORAL | 0 refills | Status: AC
Start: 2019-11-03 — End: ?

## 2019-11-03 NOTE — ED Provider Notes (Signed)
[facility]-EMERGENCY DEPT Provider Note   CSN: [id number] Arrival date & time: 11/03/19  1647     History Chief Complaint  Patient presents with  . Foot Pain    Reginald Weber is a 49 y.o. male.  The history is provided by the patient. No language interpreter was used.  Foot Pain This is a new problem. The current episode started yesterday. The problem occurs constantly. The problem has not changed since onset.Nothing aggravates the symptoms. Nothing relieves the symptoms. He has tried nothing for the symptoms. The treatment provided no relief.   Pt reports he walked 14 miles yesterday after his car broke down.  Pt complains of a blister on his foot.  Pt has a callus on his foot.  Pt has a blister on callous.     No past medical history on file.  There are no problems to display for this patient.   No past surgical history on file.     No family history on file.  Social History   Tobacco Use  . Smoking status: Current Every Day Smoker    Packs/day: 1.00    Years: 25.00    Pack years: 25.00    Types: Cigarettes  . Smokeless tobacco: Never Used  Substance Use Topics  . Alcohol use: Yes    Comment: daily  . Drug use: No    Home Medications Prior to Admission medications   Medication Sig Start Date End Date Taking? Authorizing Provider  doxycycline (VIBRA-TABS) 100 MG tablet Take 1 tablet (100 mg total) by mouth 2 (two) times daily. 11/03/19   Elson Areas, PA-C  doxycycline (VIBRAMYCIN) 100 MG capsule Take 1 capsule (100 mg total) by mouth 2 (two) times daily. 11/03/19   Elson Areas, PA-C  HYDROcodone-acetaminophen (NORCO/VICODIN) 5-325 MG tablet Take 1 tablet by mouth every 6 (six) hours as needed. 05/15/18   Cristina Gong, PA-C  hydrOXYzine (ATARAX/VISTARIL) 25 MG tablet Take 1 tablet (25 mg total) by mouth every 8 (eight) hours as needed. Patient not taking: Reported on 05/14/2018 03/23/18   Janne Napoleon, NP  predniSONE  (STERAPRED UNI-PAK 21 TAB) 10 MG (21) TBPK tablet Starting tomorrow 03/24/18 take 6 tablets (day one) then 5, 4, 3, 2, 1 Patient not taking: Reported on 05/14/2018 03/23/18   Janne Napoleon, NP    Allergies    Patient has no known allergies.  Review of Systems   Review of Systems  All other systems reviewed and are negative.   Physical Exam Updated Vital Signs BP (!) 158/99 (BP Location: Right Arm)   Pulse (!) 102   Temp 98.8 F (37.1 C) (Oral)   Resp 16   SpO2 99%   Physical Exam Vitals and nursing note reviewed.  Constitutional:      Appearance: He is well-developed.  HENT:     Head: Normocephalic.  Pulmonary:     Effort: Pulmonary effort is normal.  Abdominal:     General: There is no distension.  Musculoskeletal:        General: Normal range of motion.     Cervical back: Normal range of motion.     Comments: 5cm blister base of foot at metatarsal area,  Slight redness around area   Skin:    General: Skin is warm.  Neurological:     Mental Status: He is alert and oriented to person, place, and time.  Psychiatric:        Mood and Affect: Mood normal.  ED Results / Procedures / Treatments   Labs (all labs ordered are listed, but only abnormal results are displayed) Labs Reviewed - No data to display  EKG None  Radiology No results found.  Procedures Procedures (including critical care time)  Medications Ordered in ED Medications - No data to display  ED Course  I have reviewed the triage vital signs and the nursing notes.  Pertinent labs & imaging results that were available during my care of the patient were reviewed by me and considered in my medical decision making (see chart for details).    MDM Rules/Calculators/A&P                      MDM:  Pt bandaged, placed in an ace wrap and post op shoe  Final Clinical Impression(s) / ED Diagnoses Final diagnoses:  Blister of right foot, initial encounter    Rx / DC Orders ED Discharge Orders           Ordered    doxycycline (VIBRA-TABS) 100 MG tablet  2 times daily     11/03/19 1755    doxycycline (VIBRAMYCIN) 100 MG capsule  2 times daily     11/03/19 1755        An After Visit Summary was printed and given to the patient.    Fransico Meadow, [place] 11/03/19 Ward Chatters    Charlesetta Shanks, MD 11/05/19 Shelah Lewandowsky

## 2019-11-03 NOTE — ED Triage Notes (Signed)
Pt reports right sided foot pain. Patient reports he walked 14 miles yesterday and has a big blister where he has been cutting off his calluses on his foot. Suspicious red and hot area for cellulitis

## 2020-05-21 IMAGING — CR DG ELBOW COMPLETE 3+V*R*
4 series · 4 of 4 positions shown · non-contrast
Comparison: None

CLINICAL DATA: Pain for 1 month recently worsening, does heavy
lifting at work

EXAM:
RIGHT ELBOW - COMPLETE 3+ VIEW

[x elbow ap right]
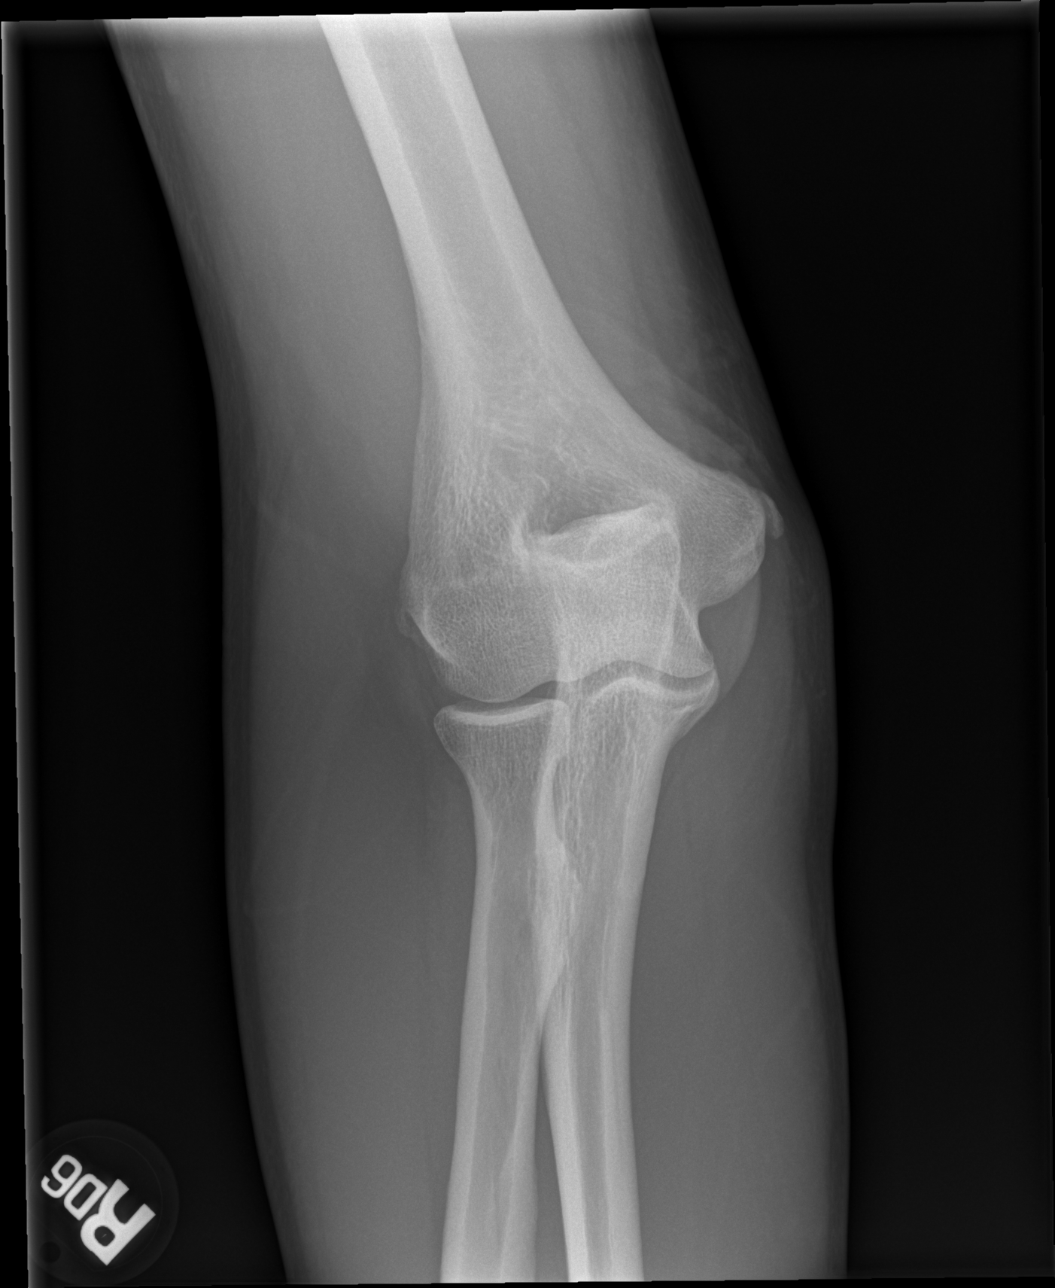

[x elbow obl right (1 of 2)]
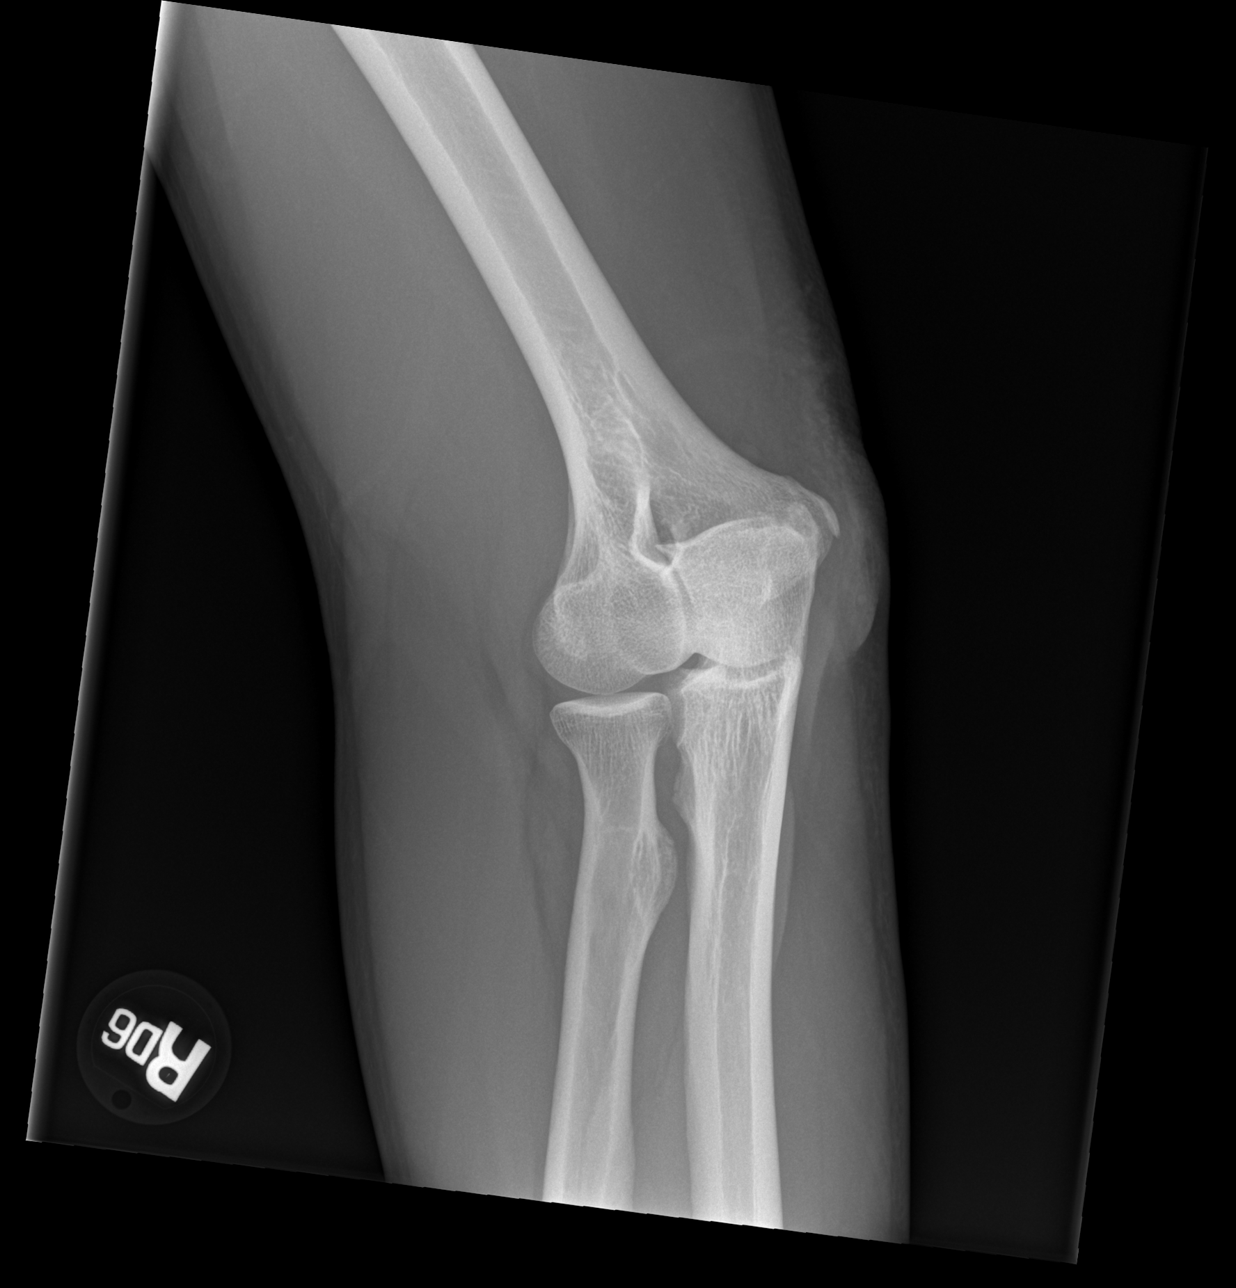

[x elbow obl right (2 of 2)]
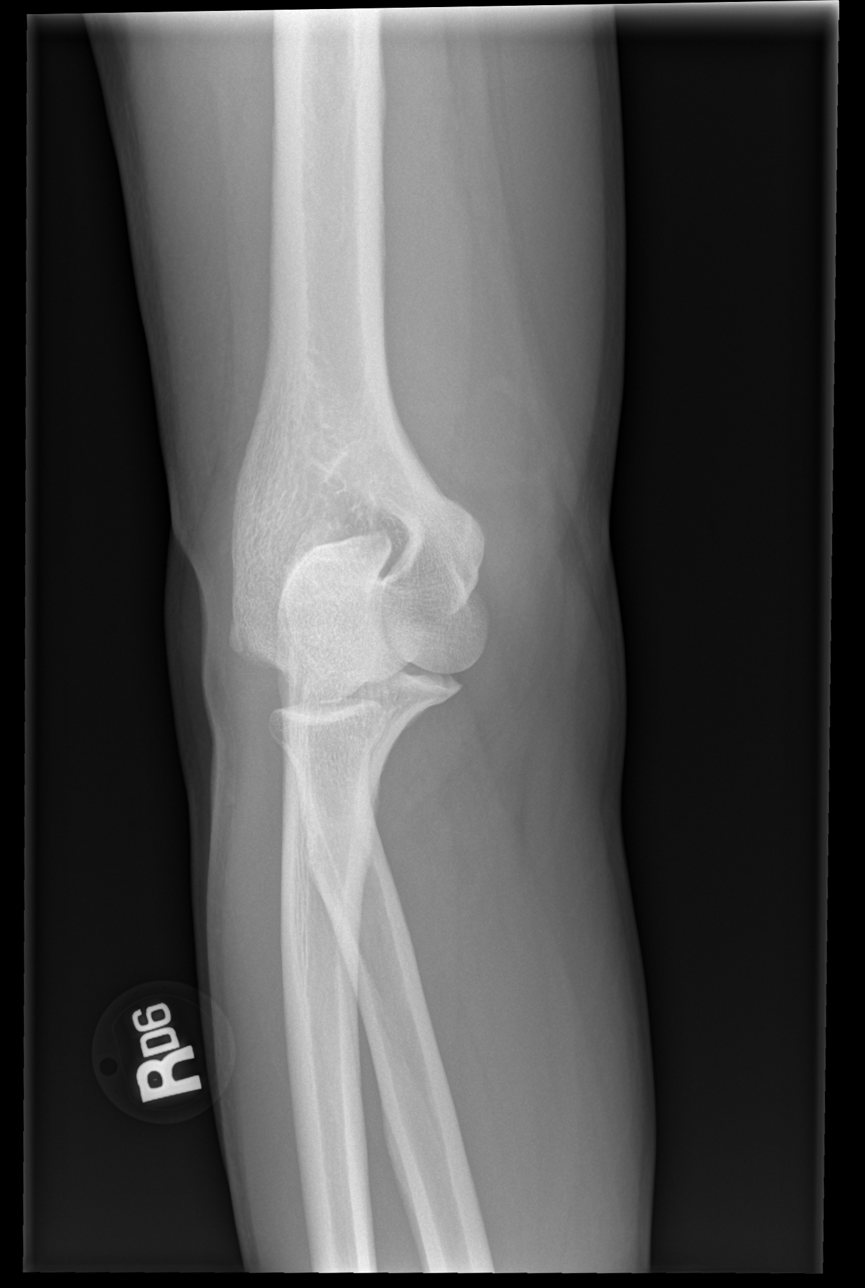

[x elbow lat right]
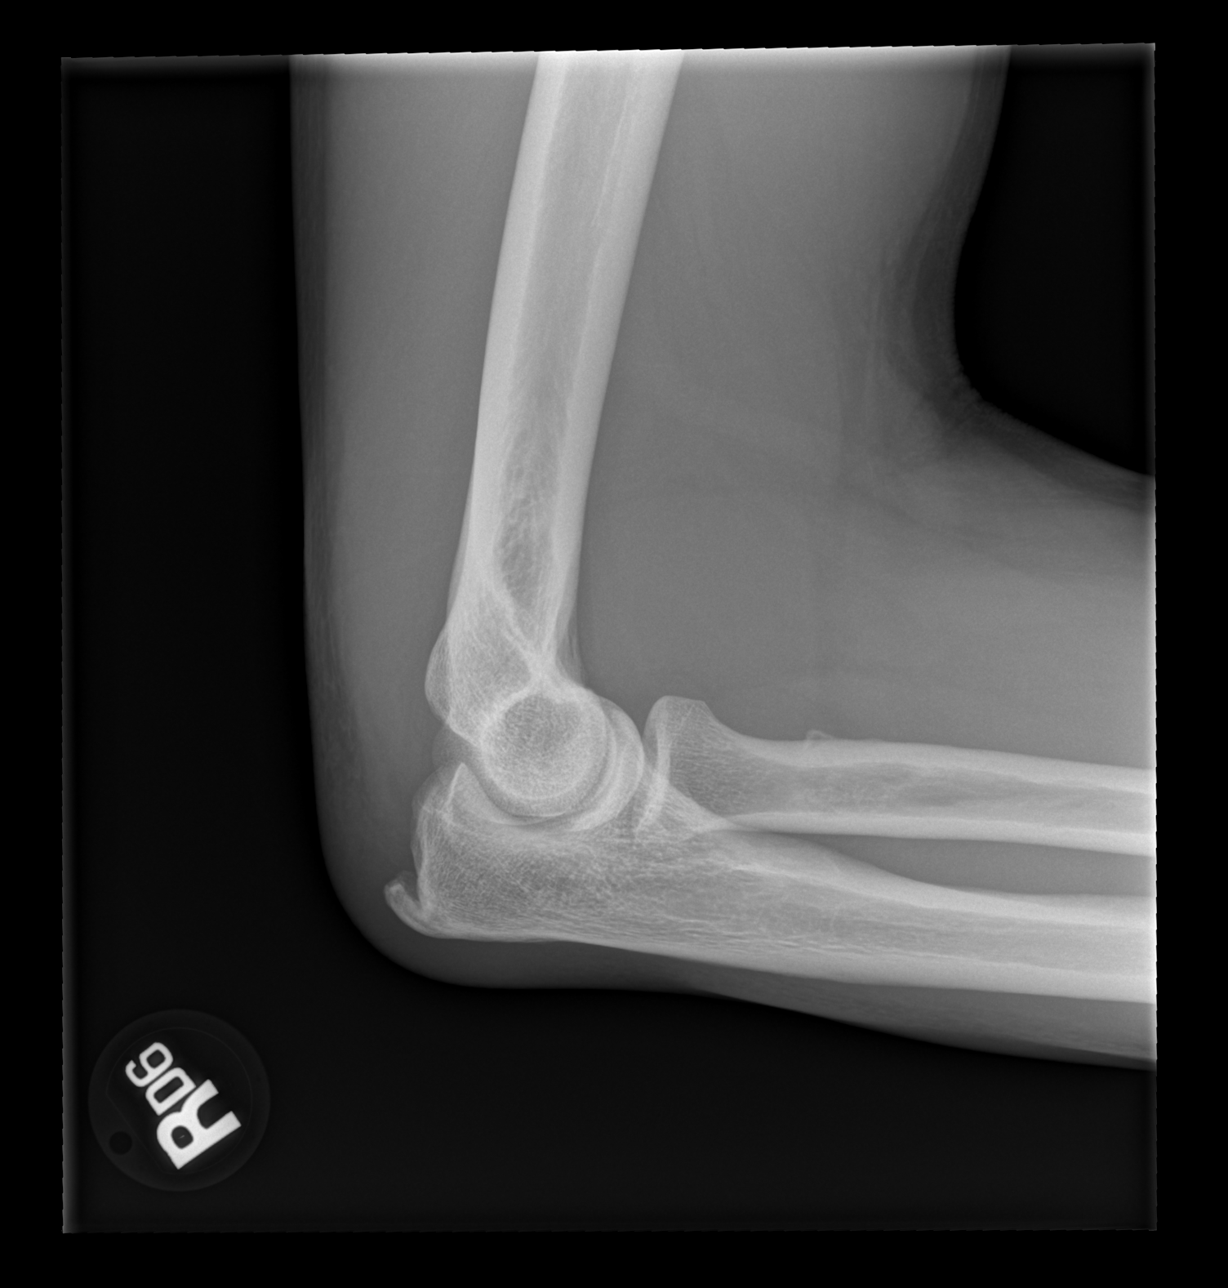

[4 of 4 positions shown; findings below may reference images not displayed]

FINDINGS: Osseous mineralization normal.

Olecranon spur.

Additional medial epicondylar spur.

No acute fracture, dislocation, or bone destruction.

No joint effusion.
IMPRESSION: No acute osseous abnormalities.

Spurs at the medial epicondyle and olecranon.
